# Patient Record
Sex: Male | Born: 1945 | Hispanic: No | Marital: Married
Health system: Northeastern US, Academic
[De-identification: ages and names within clinical notes are randomized; demographics above are authoritative.]

---

## 2021-11-06 LAB — CMP (EXT)
ALT/SGPT (EXT): 28 U/L (ref 12–45)
AST/SGOT (EXT): 23 U/L (ref 8–34)
Albumin (EXT): 4.1 g/dL (ref 3.4–5.2)
Alkaline Phosphatase (EXT): 63 U/L (ref 45–117)
Anion Gap (EXT): 10 mmol/L (ref 10–22)
BUN (EXT): 18 mg/dL (ref 7–18)
Bilirubin, Total (EXT): 0.2 mg/dL (ref 0.2–1.0)
CO2 (EXT): 28 mmol/L (ref 21–32)
CalciumCalcium (EXT): 10 mg/dL (ref 8.5–10.5)
Chloride (EXT): 99 mmol/L (ref 98–107)
Creatinine (EXT): 0.9 mg/dL (ref 0.7–1.2)
Glucose (EXT): 88 mg/dL (ref 74–160)
Potassium (EXT): 4.2 mmol/L (ref 3.5–5.1)
Protein (EXT): 8.2 g/dL (ref 6.4–8.2)
Sodium (EXT): 137 mmol/L (ref 136–145)
eGFR - Creat CKD-EPI (EXT): >60 mL/min (ref >60)

## 2021-11-08 DIAGNOSIS — K5900 Constipation, unspecified: Secondary | ICD-10-CM

## 2021-11-09 ENCOUNTER — Inpatient Hospital Stay: Admit: 2021-11-09 | Discharge: 2021-11-09 | Discharge status: Against Medical Advice | Attending: Emergency Medicine

## 2021-11-09 LAB — COMPREHENSIVE METABOLIC PANEL
ALT: 36 U/L (ref 0–55)
AST: 21 U/L (ref 6–42)
Albumin: 3.5 g/dL (ref 3.2–5.0)
Alkaline phosphatase: 60 U/L (ref 30–130)
Anion Gap: 8 mmol/L (ref 3–14)
BUN: 21 mg/dL (ref 6–24)
Bilirubin, total: 0.2 mg/dL (ref 0.2–1.2)
CO2 (Bicarbonate): 23 mmol/L (ref 20–32)
Calcium: 9.6 mg/dL (ref 8.5–10.5)
Chloride: 103 mmol/L (ref 98–110)
Creatinine: 0.89 mg/dL (ref 0.55–1.30)
Glucose: 115 mg/dL (ref 70–139)
Potassium: 4.4 mmol/L (ref 3.6–5.2)
Protein, total: 8.5 g/dL — ABNORMAL HIGH (ref 6.0–8.4)
Sodium: 134 mmol/L — ABNORMAL LOW (ref 135–146)
eGFRcr: 89 mL/min/{1.73_m2} (ref >=60)

## 2021-11-09 LAB — CBC WITH DIFFERENTIAL
Basophils %: 0.9 %
Basophils Absolute: 0.07 10*3/uL (ref 0.00–0.22)
Eosinophils %: 1.6 %
Eosinophils Absolute: 0.12 10*3/uL (ref 0.00–0.50)
Hematocrit: 39.5 % (ref 37.0–53.0)
Hemoglobin: 13.5 g/dL (ref 13.0–17.5)
Immature Granulocytes %: 1.2 %
Immature Granulocytes Absolute: 0.09 10*3/uL (ref 0.00–0.10)
Lymphocyte %: 19.6 %
Lymphocytes Absolute: 1.48 10*3/uL (ref 0.70–4.00)
MCH: 30.9 pg (ref 26.0–34.0)
MCHC: 34.2 g/dL (ref 31.0–37.0)
MCV: 90.4 fL (ref 80.0–100.0)
MPV: 8.8 fL — ABNORMAL LOW (ref 9.1–12.4)
Monocytes %: 10.1 %
Monocytes Absolute: 0.76 10*3/uL (ref 0.38–0.83)
NRBC %: 0 % (ref 0.0–0.0)
NRBC Absolute: 0 10*3/uL (ref 0.00–2.00)
Neutrophil %: 66.6 %
Neutrophils Absolute: 5.03 10*3/uL (ref 1.50–7.95)
Platelets: 362 10*3/uL (ref 150–400)
RBC: 4.37 M/uL (ref 4.20–5.90)
RDW-CV: 13.2 % (ref 11.5–14.5)
RDW-SD: 43.3 fL (ref 35.0–51.0)
WBC: 7.6 10*3/uL (ref 4.0–11.0)

## 2021-11-09 LAB — LIGHT BLUE TOP

## 2021-11-09 LAB — RAINBOW DRAW LT. GREEN PST TOP

## 2021-11-09 NOTE — ED Notes (Signed)
Pt and family asking to leave, say they cannot wait any longer. Pt A+Ox4, ambulating with steady gait. Pt and son state they will go to another hospital for follow up care. Pt and family made aware MD has signed up for case, state they are do not wish to see the doctor and do not want more care here. Pt and family member seen leaving ED with all belongings, pt ambulating with steady gait.     Larita FifeKimberly Yuktha Kerchner, RN  11/09/21 [phone]

## 2021-11-09 NOTE — ED Notes (Signed)
Pt ambulatory with steady gait to restroom.     Larita Fife, RN  11/09/21 [phone]

## 2021-11-09 NOTE — ED Notes (Signed)
Pt here visiting from [place], family at bedside. 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 Pt's primary complaint of last bowel was 5 days ago. No pain, n/v.     Larita Fife, RN  11/09/21 [phone]

## 2021-11-16 LAB — BMP (EXT)
Anion Gap (EXT): 9 mmol/L — ABNORMAL LOW (ref 10–22)
BUN (EXT): 23 mg/dL — ABNORMAL HIGH (ref 7–18)
CO2 (EXT): 27 mmol/L (ref 21–32)
CalciumCalcium (EXT): 9.9 mg/dL (ref 8.5–10.5)
Chloride (EXT): 100 mmol/L (ref 98–107)
Creatinine (EXT): 0.9 mg/dL (ref 0.7–1.2)
Glucose (EXT): 96 mg/dL (ref 74–160)
Potassium (EXT): 4.7 mmol/L (ref 3.5–5.1)
Sodium (EXT): 136 mmol/L (ref 136–145)
eGFR - Creat CKD-EPI (EXT): >60 mL/min (ref >60)

## 2021-11-16 LAB — UNMAPPED LAB RESULTS: ALT/SGPT (EXT): 27 U/L (ref 12–45)

## 2022-08-08 IMAGING — MR [facility] CER/LOMBAR
4 of 12 series · 17 of 48 positions shown · non-contrast
Comparison: none

------------- REPORT [id number] -------------
METHODOLOGY:
Examination performed with T1 and T2 weighted sequences, without intravenous administration of the paramagnetic contrast agent.
ANALYSIS:
Craniovertebral junction without significant abnormalities.
MRI OF THE CERVICAL SPINE
Bone heterogeneity of the evaluated structures.
Slight accentuation of the physiological cervical lordosis in the examination position.
Vertebral bodies with maintained height.
Diffuse disc dehydration with reduction of the disc spaces.
Irregularities, sclerosis, and degenerative subchondral changes in the contiguous cervical vertebral bodies.
Diffuse osteophytosis.
Disc-osteophyte complexes from C3-C4 to C6-C7 that obliterate the anterior cerebrospinal fluid column and compress the ventral aspect of the dural sac.
Broad-based posterocentral and left lateral protruded disc component at C5-C6.
Hypertrophy of the ligamentum flavum.
Degenerative changes of the uncovertebral and facet joints.
Reduction in the dimensions of the intervertebral foramina at C4-C5, C5-C6, and C6-C7 due to protruded disc components, uncovertebral hypertrophy, and disc-osteophyte complexes, with intimate radicular contact.
Spinal cord without detectable abnormalities.
Minimal fatty infiltration of the posterior paravertebral musculature.
DIAGNOSTIC IMPRESSION:
- Spondylodiscarthrosis and foraminal stenosis.

------------- REPORT [id number] -------------
TECHNIQUE: Examination performed with T1 and T2 weighted sequences, without intravenous administration of paramagnetic contrast agent.
Report:
Slight accentuation of the physiological lordosis in the supine position.
MRI OF THE LUMBOSACRAL SPINE
Minimal deviation of the lumbar spine axis to the right.
Degenerative changes in the contiguous endplates of the lumbar vertebrae, more evident at L4-L5 and L5-S1 with irregularities, sclerosis, and subchondral cysts with Modic I / II type changes and Schmorl's nodes.
Diffuse anterior and lateral marginal osteophytosis.
Bone heterogeneity associated with a slight reduction in the height of the vertebral body of D12, by approximately 15% with apparent irregularity and unevenness of the superior endplate.
The other vertebral bodies have preserved height and posterior alignment.
Diffuse disc dehydration.
Diffuse disc bulging from L3 to S1, more evident at L4-L5, associated with reduction of the disc space and bony irregularities in the endplates contiguous to this interspace, with disco-osteophyte complexes causing compression on the ventral aspect of the dural sac and extending to the neural foramina, promoting reduction of the foraminal dimensions at these levels.
Slight foraminal disc protrusions at L3-L4 bilaterally.
Reduction in the dimensions of the neural foramina of L4-L5 and L5-S1, more evident at L4-L5 due to disco-osteophyte complexes, protruded disc components, and degenerative changes of the facet joints, promoting compression of the emerging nerve roots.
Degenerative changes of the facet joints more evident at L4-L5 and L5-S1.
Slight thickening of the ligamentum flavum from L4 to S1.
Topical conus medullaris, with usual signal intensity.
Degenerative changes of the interspinous joints, with signs of injury to the interspinous ligament of L4-L5, which may be related to mechanical overexertion or even stretching.
Moderate liposubstitution of the posterior paravertebral musculature.
- Spondylodiscoarthrosis of L3-L4, L4-L5 and L5-S1.
- Foraminopathy of L4-L5.
- Bone heterogeneity associated with a slight reduction in the height of the vertebral body of D12, by approximately 15% with apparent irregularity and unevenness of the superior endplate.
Observation of cystic-appearing formations in both kidneys.

[Series 3: T2 · sagittal · 3.0mm · 0.49mm/px · 4 of 14 slices shown (1 of 4)]
[im 1/14]
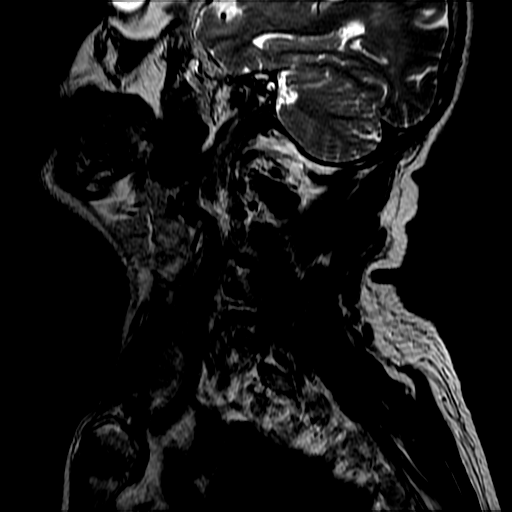
[im 5/14]
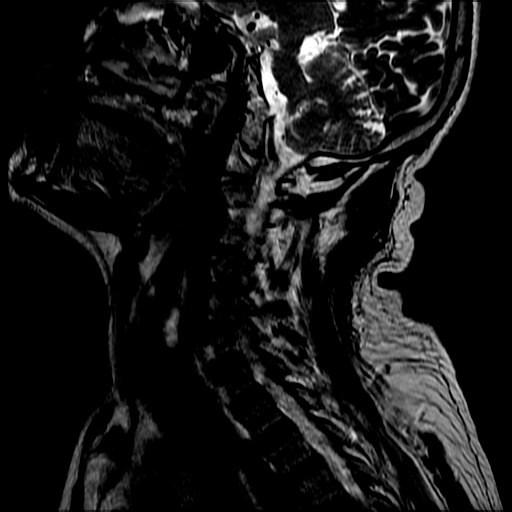
[im 9/14]
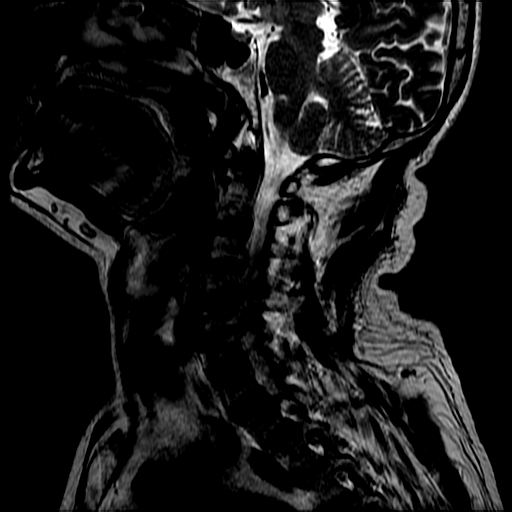
[im 14/14]
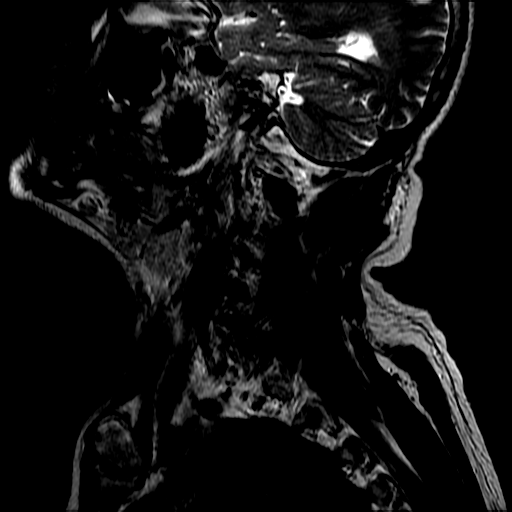

[Series 5: T2 · sagittal · 3.0mm · 0.49mm/px · 4 of 14 slices shown (2 of 4)]
[im 1/14]
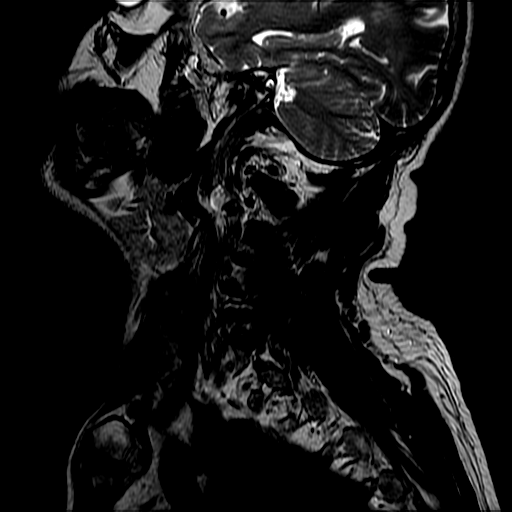
[im 5/14]
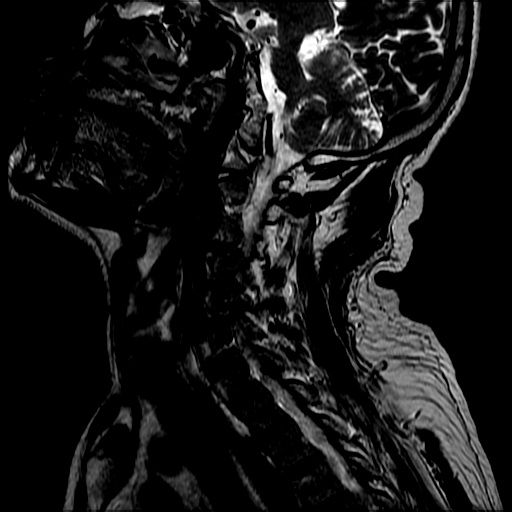
[im 9/14]
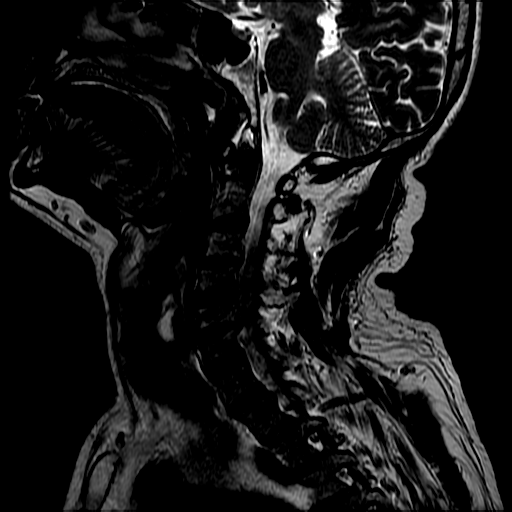
[im 14/14]
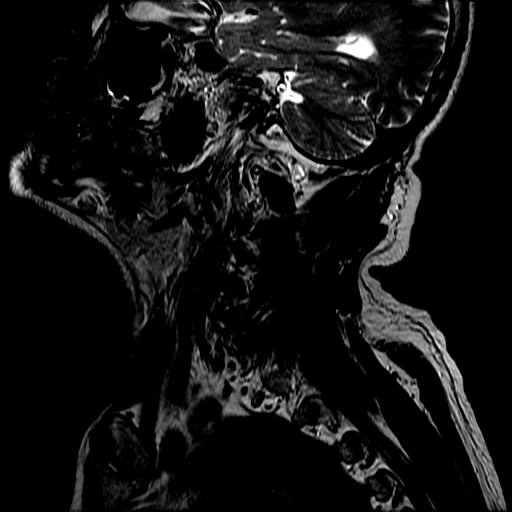

[Series 6: T2 · axial · 4.0mm · 0.35mm/px · z∈[-79,+32]mm · 6 of 26 slices shown (3 of 4)]
[im 1/26]
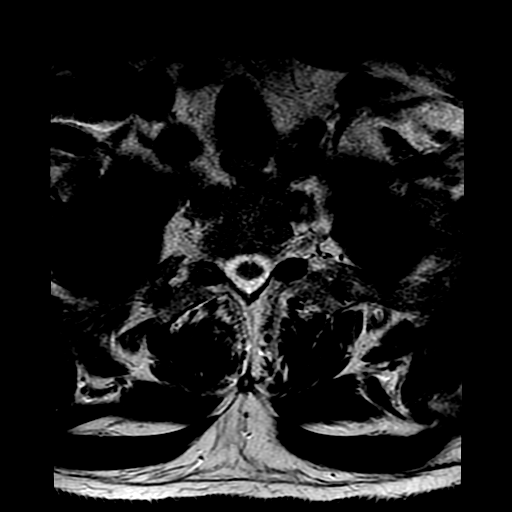
[im 6/26]
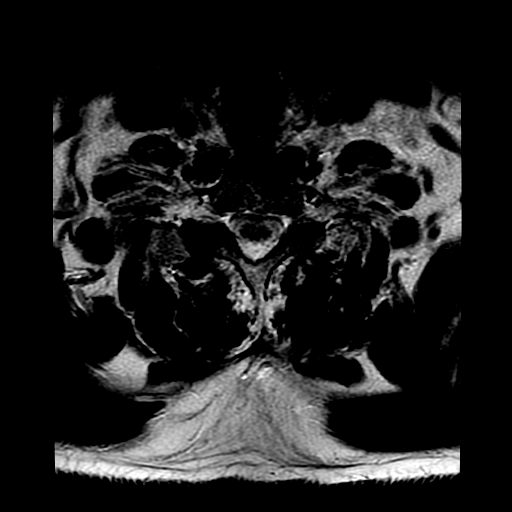
[im 11/26]
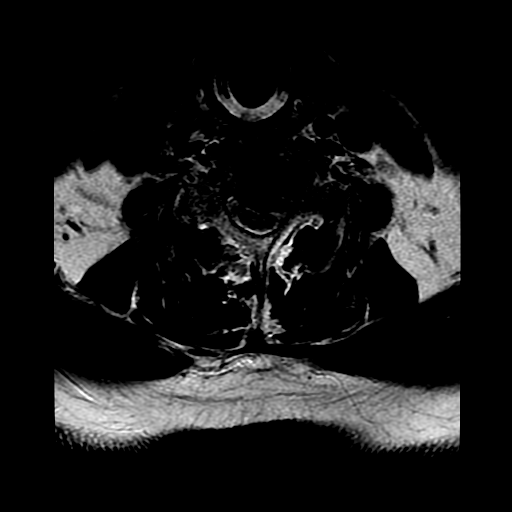
[im 16/26]
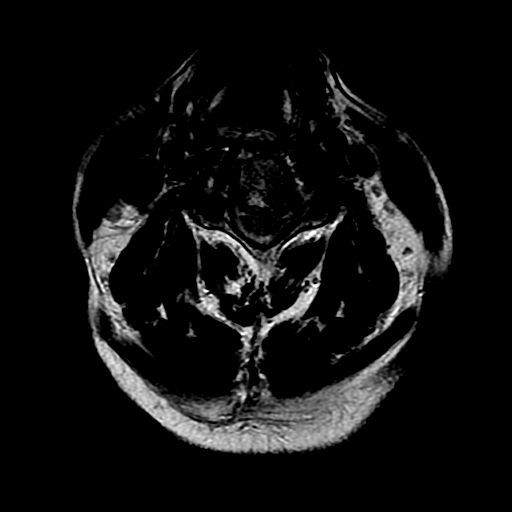
[im 21/26]
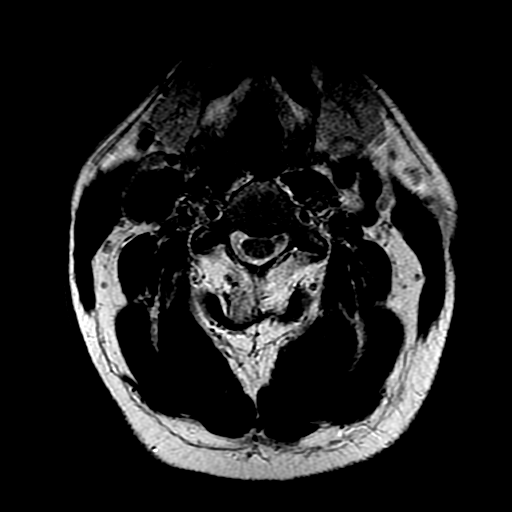
[im 26/26]
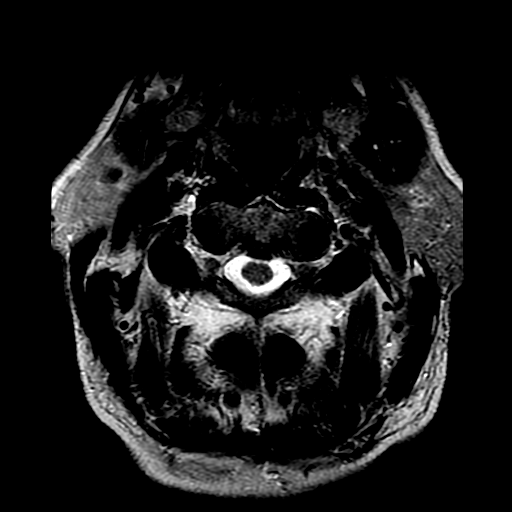

[Series 12: T2 · sagittal · 4.0mm · 0.51mm/px · 3 of 12 slices shown (4 of 4)]
[im 1/12]
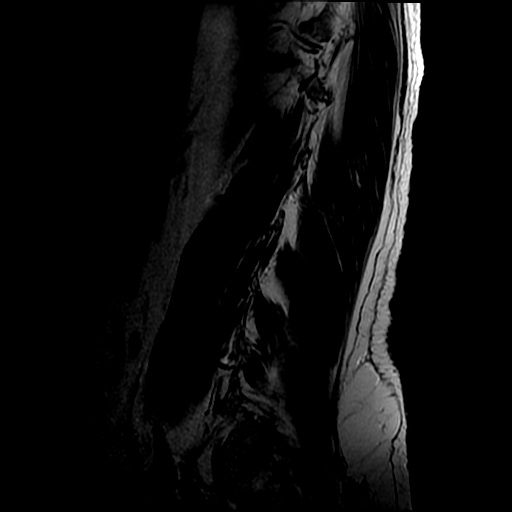
[im 6/12]
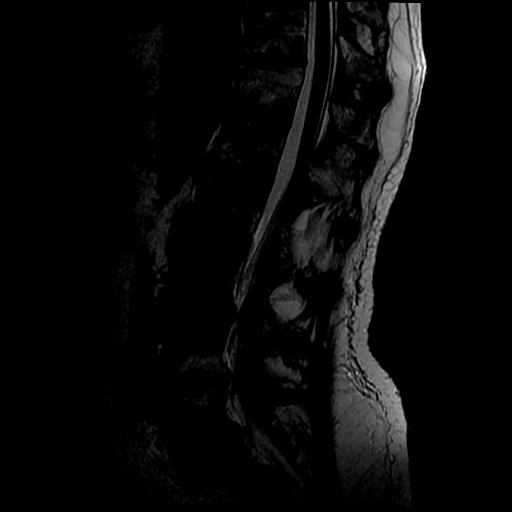
[im 12/12]
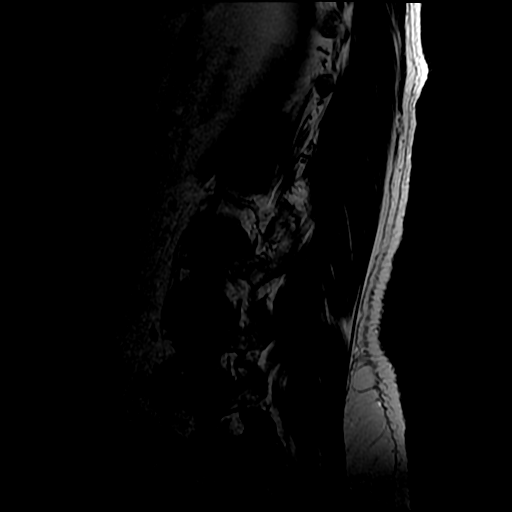

[17 of 48 positions shown; findings below may reference images not displayed]

## 2022-12-03 IMAGING — MR RM [facility] DIREIT[facility]
4 of 5 series · 19 of 40 positions shown · non-contrast
Comparison: none

[Series 4: T2 fat-sat · sagittal · 4.0mm · 0.31mm/px · 8 of 22 slices shown (1 of 2)]
[im 1/22]
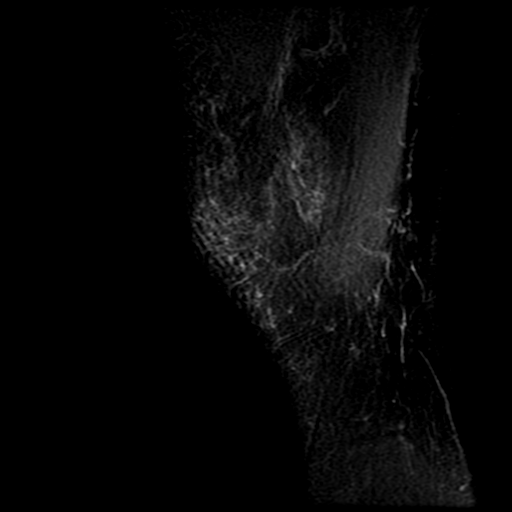
[im 4/22]
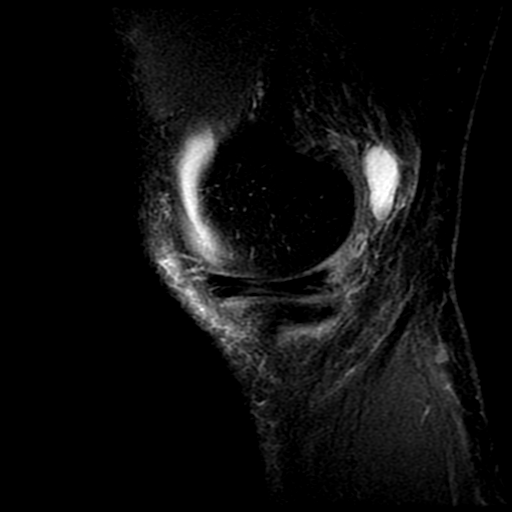
[im 7/22]
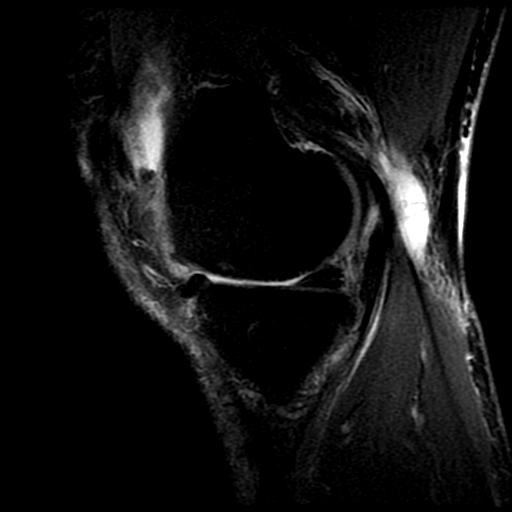
[im 10/22]
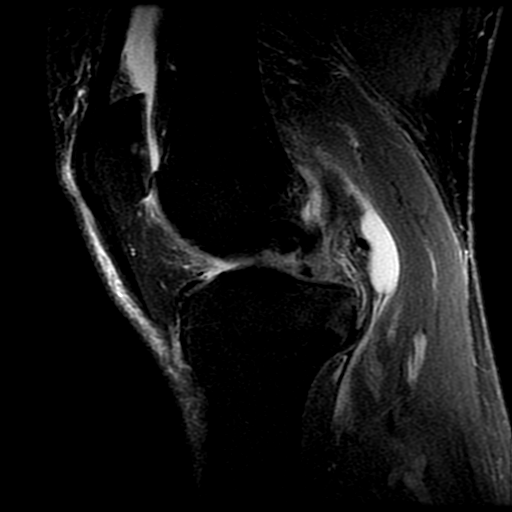
[im 13/22]
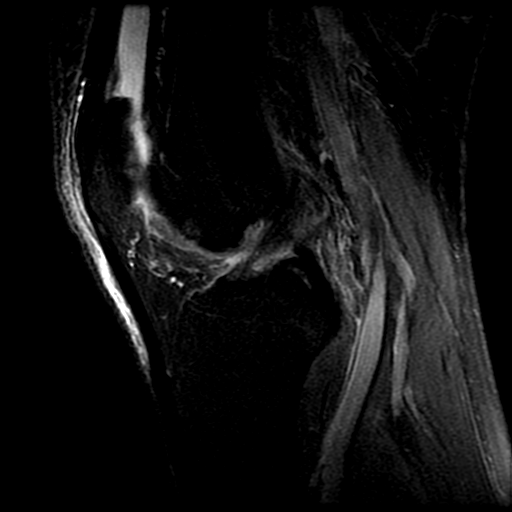
[im 16/22]
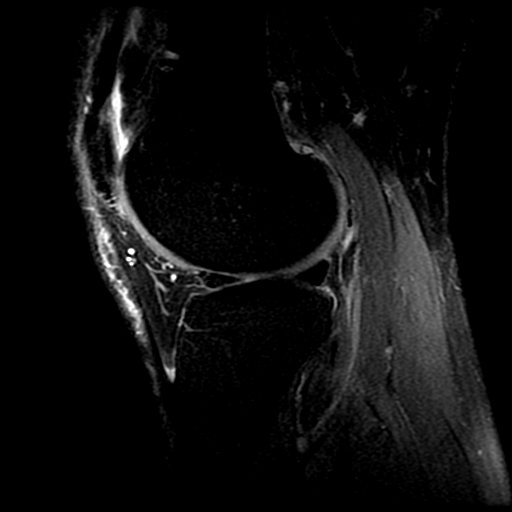
[im 19/22]
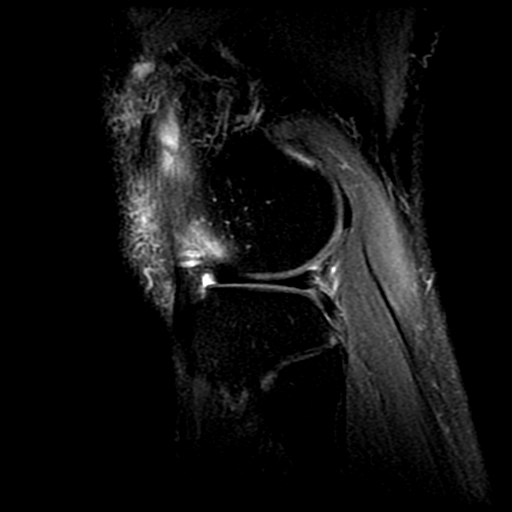
[im 22/22]
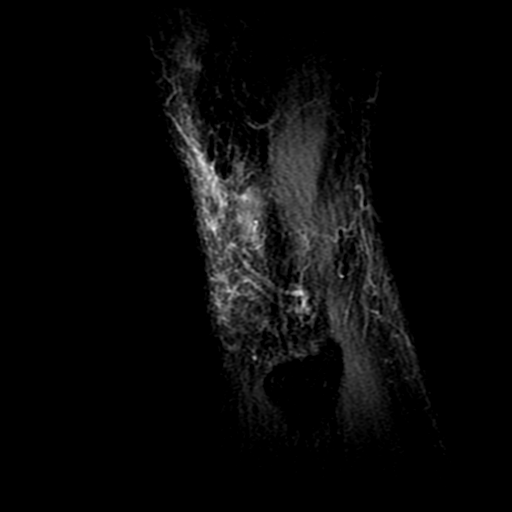

[Series 5: T1 · sagittal · 4.0mm · 0.31mm/px · 3 of 22 slices shown (1 of 2)]
[im 4/22]
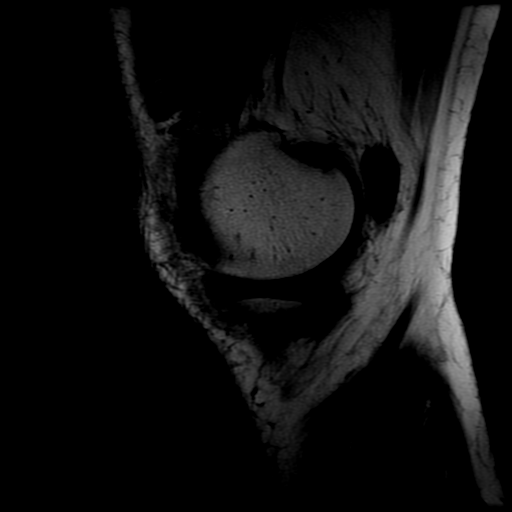
[im 13/22]
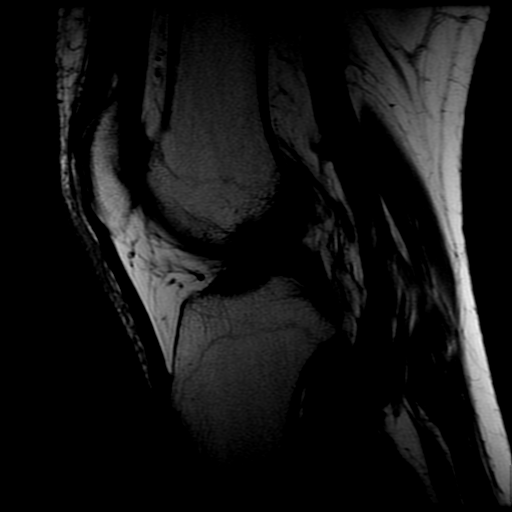
[im 19/22]
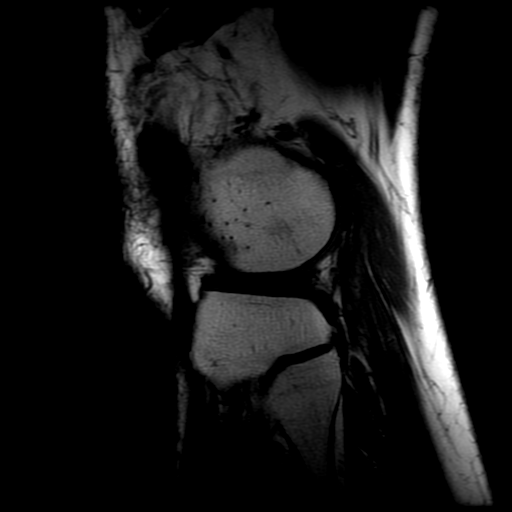

[Series 6: T2 fat-sat · coronal · 4.0mm · 0.31mm/px · 5 of 18 slices shown (2 of 2)]
[im 1/18]
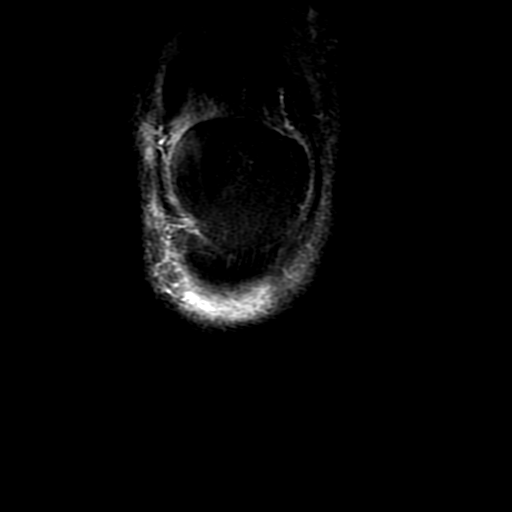
[im 3/18]
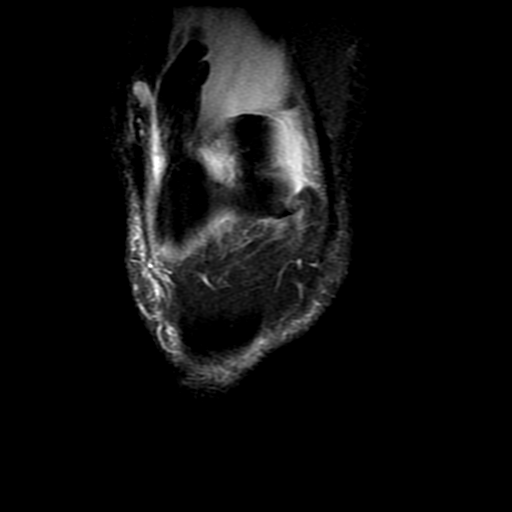
[im 6/18]
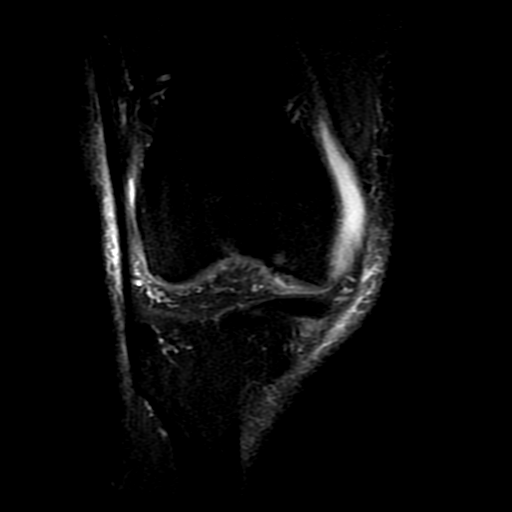
[im 9/18]
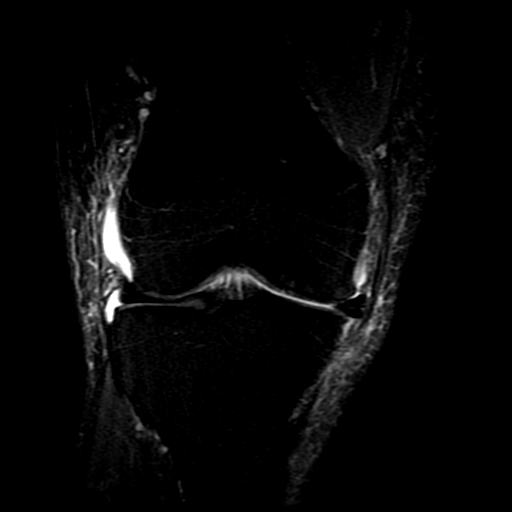
[im 15/18]
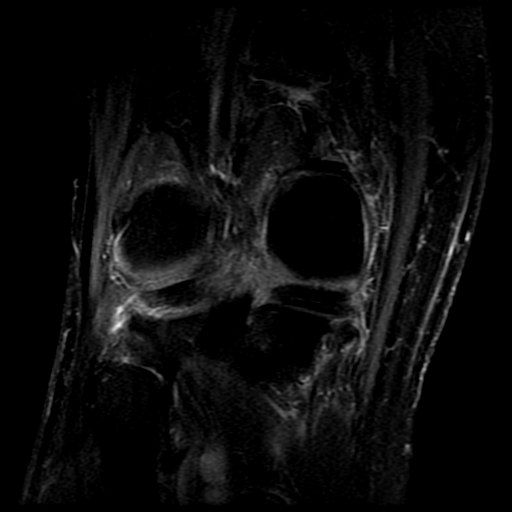

[Series 8: T1 · coronal · 4.0mm · 0.31mm/px · 3 of 18 slices shown (2 of 2)]
[im 3/18]
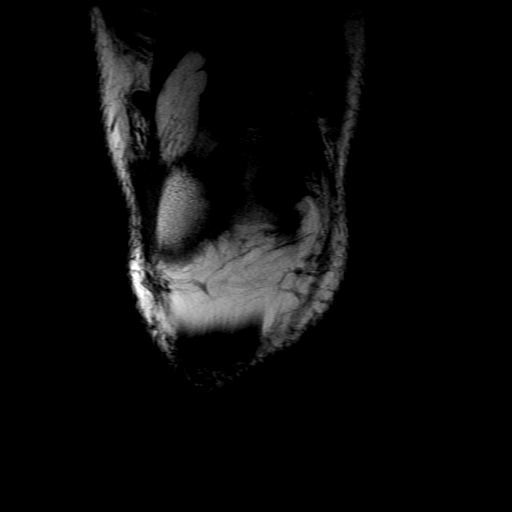
[im 9/18]
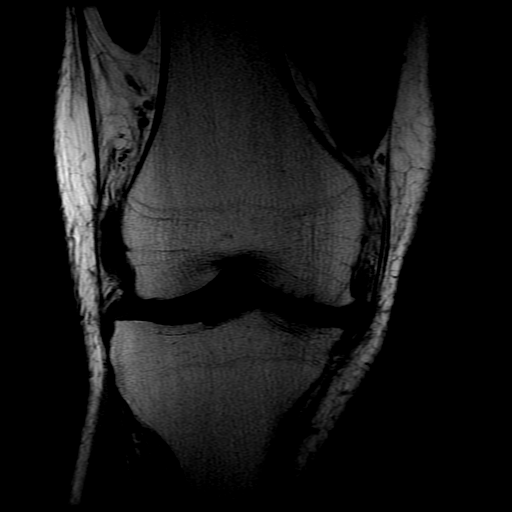
[im 15/18]
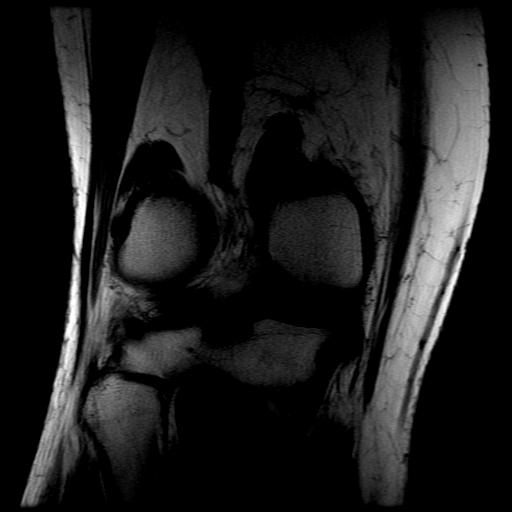

[19 of 40 positions shown; findings below may reference images not displayed]

Técnica:
Exame realizado com sequências ponderadas em T1, T2 e T2 com supressão de gordura.
Relatório:
Redução do espaço articular, osteófitos periarticulares, edema ósseo subcondral e erosões profundas na
[facility]
cartilagem de revestimento dos compartimentos patelo-femoral e fêmoro-tibiais.
Grande derrame articular com sinais de espessamento sinovial.
Cisto de Baker lobulado, bem delimitado e com paredes finas.
Extensa rotura horizontal do corpo e dos cornos do menisco medial que atinge a margem livre.
Menisco lateral de morfologia e intensidades de sinal normais.
Ligamentos cruzados e colaterais íntegros.
Tendão do quadríceps e patelar íntegros.
Ventres musculares eutróficos.
Edema de partes moles periarticulares.
Impressão:
Osteoartrose moderada patelo-femoral e fêmoro-tibial medial.
Osteoartrose leve fêmoro-tibial lateral.
Grande derrame articular, sinovite e cisto de Baker.
Rotura do menisco medial.
Edema de partes moles periarticulares.
